# Patient Record
Sex: Male | Born: 2000 | Race: White | Hispanic: Yes | Marital: Single | Smoking: Light tobacco smoker
Health system: Southern US, Community
[De-identification: ages and names within clinical notes are randomized; demographics above are authoritative.]

---

## 2019-11-15 ENCOUNTER — Inpatient Hospital Stay (HOSPITAL_COMMUNITY): Payer: No Typology Code available for payment source | Admitting: Certified Registered Nurse Anesthetist

## 2019-11-15 ENCOUNTER — Encounter (HOSPITAL_COMMUNITY): Payer: Self-pay

## 2019-11-15 ENCOUNTER — Observation Stay (HOSPITAL_COMMUNITY)
Admission: EM | Admit: 2019-11-15 | Discharge: 2019-11-17 | Disposition: A | Discharge status: Home / Self Care | Payer: No Typology Code available for payment source | Attending: Orthopedic Surgery | Admitting: Orthopedic Surgery

## 2019-11-15 ENCOUNTER — Inpatient Hospital Stay (HOSPITAL_COMMUNITY): Payer: No Typology Code available for payment source

## 2019-11-15 ENCOUNTER — Encounter (HOSPITAL_COMMUNITY)
Admission: EM | Disposition: A | Discharge status: Home / Self Care | Payer: Self-pay | Source: Home / Self Care | Attending: Orthopedic Surgery

## 2019-11-15 ENCOUNTER — Emergency Department (HOSPITAL_COMMUNITY): Payer: No Typology Code available for payment source

## 2019-11-15 DIAGNOSIS — Z419 Encounter for procedure for purposes other than remedying health state, unspecified: Secondary | ICD-10-CM

## 2019-11-15 DIAGNOSIS — Y929 Unspecified place or not applicable: Secondary | ICD-10-CM | POA: Insufficient documentation

## 2019-11-15 DIAGNOSIS — Y939 Activity, unspecified: Secondary | ICD-10-CM | POA: Insufficient documentation

## 2019-11-15 DIAGNOSIS — S7291XA Unspecified fracture of right femur, initial encounter for closed fracture: Principal | ICD-10-CM | POA: Insufficient documentation

## 2019-11-15 DIAGNOSIS — Z23 Encounter for immunization: Secondary | ICD-10-CM | POA: Insufficient documentation

## 2019-11-15 DIAGNOSIS — S79821A Other specified injuries of right thigh, initial encounter: Secondary | ICD-10-CM | POA: Diagnosis present

## 2019-11-15 DIAGNOSIS — S7290XA Unspecified fracture of unspecified femur, initial encounter for closed fracture: Secondary | ICD-10-CM | POA: Diagnosis present

## 2019-11-15 DIAGNOSIS — S7291XD Unspecified fracture of right femur, subsequent encounter for closed fracture with routine healing: Secondary | ICD-10-CM

## 2019-11-15 DIAGNOSIS — Z20822 Contact with and (suspected) exposure to covid-19: Secondary | ICD-10-CM | POA: Insufficient documentation

## 2019-11-15 DIAGNOSIS — S72331D Displaced oblique fracture of shaft of right femur, subsequent encounter for closed fracture with routine healing: Secondary | ICD-10-CM

## 2019-11-15 DIAGNOSIS — Y999 Unspecified external cause status: Secondary | ICD-10-CM | POA: Insufficient documentation

## 2019-11-15 DIAGNOSIS — Q899 Congenital malformation, unspecified: Secondary | ICD-10-CM

## 2019-11-15 HISTORY — PX: FEMUR IM NAIL: SHX1597

## 2019-11-15 LAB — CBC
HCT: 44.8 % (ref 39.0–52.0)
Hemoglobin: 15.1 g/dL (ref 13.0–17.0)
MCH: 30.4 pg (ref 26.0–34.0)
MCHC: 33.7 g/dL (ref 30.0–36.0)
MCV: 90.3 fL (ref 80.0–100.0)
Platelets: 259 10*3/uL (ref 150–400)
RBC: 4.96 MIL/uL (ref 4.22–5.81)
RDW: 12.1 % (ref 11.5–15.5)
WBC: 15.6 10*3/uL — ABNORMAL HIGH (ref 4.0–10.5)
nRBC: 0 % (ref 0.0–0.2)

## 2019-11-15 LAB — COMPREHENSIVE METABOLIC PANEL
ALT: 47 U/L — ABNORMAL HIGH (ref 0–44)
AST: 49 U/L — ABNORMAL HIGH (ref 15–41)
Albumin: 4 g/dL (ref 3.5–5.0)
Alkaline Phosphatase: 45 U/L (ref 38–126)
Anion gap: 12 (ref 5–15)
BUN: 10 mg/dL (ref 6–20)
CO2: 22 mmol/L (ref 22–32)
Calcium: 8.7 mg/dL — ABNORMAL LOW (ref 8.9–10.3)
Chloride: 104 mmol/L (ref 98–111)
Creatinine, Ser: 0.99 mg/dL (ref 0.61–1.24)
GFR calc Af Amer: >60 mL/min (ref >60)
GFR calc non Af Amer: >60 mL/min (ref >60)
Glucose, Bld: 139 mg/dL — ABNORMAL HIGH (ref 70–99)
Potassium: 3.7 mmol/L (ref 3.5–5.1)
Sodium: 138 mmol/L (ref 135–145)
Total Bilirubin: 1 mg/dL (ref 0.3–1.2)
Total Protein: 6.2 g/dL — ABNORMAL LOW (ref 6.5–8.1)

## 2019-11-15 LAB — I-STAT CHEM 8, ED
BUN: 10 mg/dL (ref 6–20)
Calcium, Ion: 1.09 mmol/L — ABNORMAL LOW (ref 1.15–1.40)
Chloride: 105 mmol/L (ref 98–111)
Creatinine, Ser: 1.3 mg/dL — ABNORMAL HIGH (ref 0.61–1.24)
Glucose, Bld: 137 mg/dL — ABNORMAL HIGH (ref 70–99)
HCT: 43 % (ref 39.0–52.0)
Hemoglobin: 14.6 g/dL (ref 13.0–17.0)
Potassium: 3.5 mmol/L (ref 3.5–5.1)
Sodium: 142 mmol/L (ref 135–145)
TCO2: 23 mmol/L (ref 22–32)

## 2019-11-15 LAB — SAMPLE TO BLOOD BANK

## 2019-11-15 LAB — SARS CORONAVIRUS 2 BY RT PCR (HOSPITAL ORDER, PERFORMED IN ~~LOC~~ HOSPITAL LAB): SARS Coronavirus 2: NEGATIVE

## 2019-11-15 LAB — MRSA PCR SCREENING: MRSA by PCR: NEGATIVE

## 2019-11-15 LAB — PROTIME-INR
INR: 1 (ref 0.8–1.2)
Prothrombin Time: 12.8 seconds (ref 11.4–15.2)

## 2019-11-15 LAB — LACTIC ACID, PLASMA: Lactic Acid, Venous: 3.4 mmol/L (ref 0.5–1.9)

## 2019-11-15 LAB — ETHANOL: Alcohol, Ethyl (B): 119 mg/dL — ABNORMAL HIGH (ref <10)

## 2019-11-15 SURGERY — INSERTION, INTRAMEDULLARY ROD, FEMUR
Anesthesia: General | Site: Leg Upper | Laterality: Right

## 2019-11-15 MED ORDER — MIDAZOLAM HCL 2 MG/2ML IJ SOLN
INTRAMUSCULAR | Status: AC
  Filled 2019-11-15: qty 2

## 2019-11-15 MED ORDER — CEFAZOLIN SODIUM-DEXTROSE 2-4 GM/100ML-% IV SOLN
2.0000 g | INTRAVENOUS | Status: DC
  Administered 2019-11-15: 2 g via INTRAVENOUS
  Filled 2019-11-15: qty 100

## 2019-11-15 MED ORDER — PROPOFOL 10 MG/ML IV BOLUS
INTRAVENOUS | Status: AC
  Filled 2019-11-15: qty 20

## 2019-11-15 MED ORDER — FENTANYL CITRATE (PF) 100 MCG/2ML IJ SOLN
INTRAMUSCULAR | Status: AC
  Filled 2019-11-15: qty 2

## 2019-11-15 MED ORDER — HYDROMORPHONE HCL 1 MG/ML IJ SOLN
1.0000 mg | Freq: Once | INTRAMUSCULAR | Status: AC
  Administered 2019-11-15: 1 mg via INTRAVENOUS
  Filled 2019-11-15: qty 1

## 2019-11-15 MED ORDER — SUCCINYLCHOLINE CHLORIDE 200 MG/10ML IV SOSY
PREFILLED_SYRINGE | INTRAVENOUS | Status: AC
  Filled 2019-11-15: qty 10

## 2019-11-15 MED ORDER — ACETAMINOPHEN 500 MG PO TABS: 1000.0000 mg | ORAL_TABLET | Freq: Once | ORAL | Status: DC | PRN

## 2019-11-15 MED ORDER — FENTANYL CITRATE (PF) 250 MCG/5ML IJ SOLN
INTRAMUSCULAR | Status: AC
  Filled 2019-11-15: qty 5

## 2019-11-15 MED ORDER — LACTATED RINGERS IV BOLUS
1000.0000 mL | Freq: Once | INTRAVENOUS | Status: AC
  Administered 2019-11-15: 1000 mL via INTRAVENOUS

## 2019-11-15 MED ORDER — CEFAZOLIN SODIUM-DEXTROSE 2-4 GM/100ML-% IV SOLN: 2.0000 g | INTRAVENOUS | Status: DC

## 2019-11-15 MED ORDER — FENTANYL CITRATE (PF) 100 MCG/2ML IJ SOLN
100.0000 ug | Freq: Once | INTRAMUSCULAR | Status: AC
  Administered 2019-11-15: 100 ug via INTRAVENOUS

## 2019-11-15 MED ORDER — ACETAMINOPHEN 10 MG/ML IV SOLN
INTRAVENOUS | Status: AC
  Administered 2019-11-15: 1000 mg via INTRAVENOUS
  Filled 2019-11-15: qty 100

## 2019-11-15 MED ORDER — DEXAMETHASONE SODIUM PHOSPHATE 10 MG/ML IJ SOLN
INTRAMUSCULAR | Status: DC | PRN
  Administered 2019-11-15: 10 mg via INTRAVENOUS

## 2019-11-15 MED ORDER — ASPIRIN EC 81 MG PO TBEC
81.0000 mg | DELAYED_RELEASE_TABLET | Freq: Every day | ORAL | Status: DC
  Administered 2019-11-16: 81 mg via ORAL
  Filled 2019-11-15: qty 1

## 2019-11-15 MED ORDER — CHLORHEXIDINE GLUCONATE 0.12 % MT SOLN
15.0000 mL | Freq: Once | OROMUCOSAL | Status: AC
  Filled 2019-11-15: qty 15

## 2019-11-15 MED ORDER — ONDANSETRON HCL 4 MG/2ML IJ SOLN
INTRAMUSCULAR | Status: DC | PRN
  Administered 2019-11-15: 4 mg via INTRAVENOUS

## 2019-11-15 MED ORDER — ONDANSETRON HCL 4 MG/2ML IJ SOLN
4.0000 mg | Freq: Four times a day (QID) | INTRAMUSCULAR | Status: DC | PRN
  Administered 2019-11-15: 4 mg via INTRAVENOUS
  Filled 2019-11-15 (×2): qty 2

## 2019-11-15 MED ORDER — ONDANSETRON HCL 4 MG PO TABS
4.0000 mg | ORAL_TABLET | Freq: Four times a day (QID) | ORAL | Status: DC | PRN
  Administered 2019-11-17: 4 mg via ORAL
  Filled 2019-11-15: qty 1

## 2019-11-15 MED ORDER — DEXAMETHASONE SODIUM PHOSPHATE 10 MG/ML IJ SOLN
INTRAMUSCULAR | Status: AC
  Filled 2019-11-15: qty 1

## 2019-11-15 MED ORDER — PHENYLEPHRINE 40 MCG/ML (10ML) SYRINGE FOR IV PUSH (FOR BLOOD PRESSURE SUPPORT)
PREFILLED_SYRINGE | INTRAVENOUS | Status: AC
  Filled 2019-11-15: qty 10

## 2019-11-15 MED ORDER — TETANUS-DIPHTH-ACELL PERTUSSIS 5-2.5-18.5 LF-MCG/0.5 IM SUSP
0.5000 mL | Freq: Once | INTRAMUSCULAR | Status: AC
  Administered 2019-11-15: 0.5 mL via INTRAMUSCULAR
  Filled 2019-11-15: qty 0.5

## 2019-11-15 MED ORDER — CELECOXIB 200 MG PO CAPS
200.0000 mg | ORAL_CAPSULE | Freq: Two times a day (BID) | ORAL | Status: DC
  Administered 2019-11-15 – 2019-11-17 (×4): 200 mg via ORAL
  Filled 2019-11-15 (×5): qty 1

## 2019-11-15 MED ORDER — ROCURONIUM BROMIDE 10 MG/ML (PF) SYRINGE
PREFILLED_SYRINGE | INTRAVENOUS | Status: DC | PRN
  Administered 2019-11-15 (×2): 30 mg via INTRAVENOUS
  Administered 2019-11-15: 100 mg via INTRAVENOUS

## 2019-11-15 MED ORDER — OXYCODONE HCL 5 MG PO TABS
ORAL_TABLET | ORAL | Status: AC
  Filled 2019-11-15: qty 1

## 2019-11-15 MED ORDER — HYDROCODONE-ACETAMINOPHEN 5-325 MG PO TABS
1.0000 | ORAL_TABLET | ORAL | Status: DC | PRN
  Administered 2019-11-15 – 2019-11-17 (×4): 2 via ORAL
  Filled 2019-11-15 (×4): qty 2

## 2019-11-15 MED ORDER — HYDROMORPHONE HCL 1 MG/ML IJ SOLN
1.0000 mg | Freq: Once | INTRAMUSCULAR | Status: AC
  Administered 2019-11-15: 1 mg via INTRAVENOUS

## 2019-11-15 MED ORDER — OXYCODONE HCL 5 MG PO TABS
5.0000 mg | ORAL_TABLET | Freq: Once | ORAL | Status: AC | PRN
  Administered 2019-11-15: 5 mg via ORAL

## 2019-11-15 MED ORDER — ACETAMINOPHEN 160 MG/5ML PO SOLN: 1000.0000 mg | Freq: Once | ORAL | Status: DC | PRN

## 2019-11-15 MED ORDER — POVIDONE-IODINE 10 % EX SWAB
2.0000 "application " | Freq: Once | CUTANEOUS | Status: AC
  Administered 2019-11-15: 2 via TOPICAL

## 2019-11-15 MED ORDER — ALBUMIN HUMAN 5 % IV SOLN: INTRAVENOUS | Status: DC | PRN

## 2019-11-15 MED ORDER — PHENYLEPHRINE 40 MCG/ML (10ML) SYRINGE FOR IV PUSH (FOR BLOOD PRESSURE SUPPORT)
PREFILLED_SYRINGE | INTRAVENOUS | Status: DC | PRN
  Administered 2019-11-15: 80 ug via INTRAVENOUS

## 2019-11-15 MED ORDER — METOCLOPRAMIDE HCL 5 MG PO TABS: 5.0000 mg | ORAL_TABLET | Freq: Three times a day (TID) | ORAL | Status: DC | PRN

## 2019-11-15 MED ORDER — MORPHINE SULFATE (PF) 2 MG/ML IV SOLN: 0.5000 mg | INTRAVENOUS | Status: DC | PRN

## 2019-11-15 MED ORDER — SUGAMMADEX SODIUM 200 MG/2ML IV SOLN
INTRAVENOUS | Status: DC | PRN
  Administered 2019-11-15: 200 mg via INTRAVENOUS

## 2019-11-15 MED ORDER — ACETAMINOPHEN 325 MG PO TABS: 325.0000 mg | ORAL_TABLET | Freq: Four times a day (QID) | ORAL | Status: DC | PRN

## 2019-11-15 MED ORDER — DEXMEDETOMIDINE HCL 200 MCG/2ML IV SOLN
INTRAVENOUS | Status: DC | PRN
  Administered 2019-11-15: 12 ug via INTRAVENOUS
  Administered 2019-11-15: 8 ug via INTRAVENOUS

## 2019-11-15 MED ORDER — OXYCODONE HCL 5 MG/5ML PO SOLN: 5.0000 mg | Freq: Once | ORAL | Status: AC | PRN

## 2019-11-15 MED ORDER — MIDAZOLAM HCL 2 MG/2ML IJ SOLN
INTRAMUSCULAR | Status: DC | PRN
  Administered 2019-11-15: 2 mg via INTRAVENOUS

## 2019-11-15 MED ORDER — POVIDONE-IODINE 10 % EX SWAB: 2.0000 "application " | Freq: Once | CUTANEOUS | Status: DC

## 2019-11-15 MED ORDER — ENSURE PRE-SURGERY PO LIQD
296.0000 mL | Freq: Once | ORAL | Status: AC
  Administered 2019-11-15: 296 mL via ORAL
  Filled 2019-11-15: qty 296

## 2019-11-15 MED ORDER — LACTATED RINGERS IV SOLN: INTRAVENOUS | Status: DC | PRN

## 2019-11-15 MED ORDER — HYDROMORPHONE HCL 1 MG/ML IJ SOLN
INTRAMUSCULAR | Status: AC
  Filled 2019-11-15: qty 1

## 2019-11-15 MED ORDER — SODIUM CHLORIDE 0.9 % IV SOLN: INTRAVENOUS | Status: DC

## 2019-11-15 MED ORDER — METOCLOPRAMIDE HCL 5 MG/ML IJ SOLN: 5.0000 mg | Freq: Three times a day (TID) | INTRAMUSCULAR | Status: DC | PRN

## 2019-11-15 MED ORDER — ROCURONIUM BROMIDE 10 MG/ML (PF) SYRINGE
PREFILLED_SYRINGE | INTRAVENOUS | Status: AC
  Filled 2019-11-15: qty 10

## 2019-11-15 MED ORDER — LIDOCAINE 2% (20 MG/ML) 5 ML SYRINGE
INTRAMUSCULAR | Status: AC
  Filled 2019-11-15: qty 5

## 2019-11-15 MED ORDER — PROPOFOL 10 MG/ML IV BOLUS
INTRAVENOUS | Status: DC | PRN
  Administered 2019-11-15 (×2): 30 mg via INTRAVENOUS
  Administered 2019-11-15 (×2): 20 mg via INTRAVENOUS
  Administered 2019-11-15: 150 mg via INTRAVENOUS

## 2019-11-15 MED ORDER — 0.9 % SODIUM CHLORIDE (POUR BTL) OPTIME
TOPICAL | Status: DC | PRN
  Administered 2019-11-15: 2000 mL

## 2019-11-15 MED ORDER — HYDROMORPHONE HCL 1 MG/ML IJ SOLN
INTRAMUSCULAR | Status: AC
  Administered 2019-11-15: 0.5 mg via INTRAVENOUS
  Filled 2019-11-15: qty 1

## 2019-11-15 MED ORDER — EPHEDRINE 5 MG/ML INJ
INTRAVENOUS | Status: AC
  Filled 2019-11-15: qty 10

## 2019-11-15 MED ORDER — ONDANSETRON HCL 4 MG/2ML IJ SOLN
INTRAMUSCULAR | Status: AC
  Filled 2019-11-15: qty 2

## 2019-11-15 MED ORDER — DEXMEDETOMIDINE (PRECEDEX) IN NS 20 MCG/5ML (4 MCG/ML) IV SYRINGE
PREFILLED_SYRINGE | INTRAVENOUS | Status: AC
  Filled 2019-11-15: qty 5

## 2019-11-15 MED ORDER — IOHEXOL 300 MG/ML  SOLN
100.0000 mL | Freq: Once | INTRAMUSCULAR | Status: AC | PRN
  Administered 2019-11-15: 100 mL via INTRAVENOUS

## 2019-11-15 MED ORDER — HYDROMORPHONE HCL 1 MG/ML IJ SOLN
0.2500 mg | INTRAMUSCULAR | Status: DC | PRN
  Administered 2019-11-15: 0.5 mg via INTRAVENOUS

## 2019-11-15 MED ORDER — LIDOCAINE 2% (20 MG/ML) 5 ML SYRINGE
INTRAMUSCULAR | Status: DC | PRN
  Administered 2019-11-15: 60 mg via INTRAVENOUS

## 2019-11-15 MED ORDER — ACETAMINOPHEN 10 MG/ML IV SOLN: 1000.0000 mg | Freq: Once | INTRAVENOUS | Status: DC | PRN

## 2019-11-15 MED ORDER — DOCUSATE SODIUM 100 MG PO CAPS
100.0000 mg | ORAL_CAPSULE | Freq: Two times a day (BID) | ORAL | Status: DC
  Administered 2019-11-15 – 2019-11-17 (×4): 100 mg via ORAL
  Filled 2019-11-15 (×4): qty 1

## 2019-11-15 MED ORDER — CEFAZOLIN SODIUM-DEXTROSE 2-4 GM/100ML-% IV SOLN
2.0000 g | Freq: Four times a day (QID) | INTRAVENOUS | Status: AC
  Administered 2019-11-15 – 2019-11-16 (×3): 2 g via INTRAVENOUS
  Filled 2019-11-15 (×3): qty 100

## 2019-11-15 MED ORDER — SODIUM CHLORIDE 0.9 % IV SOLN: INTRAVENOUS | Status: AC

## 2019-11-15 MED ORDER — FENTANYL CITRATE (PF) 100 MCG/2ML IJ SOLN
50.0000 ug | INTRAMUSCULAR | Status: DC | PRN
  Administered 2019-11-15: 50 ug via INTRAVENOUS
  Filled 2019-11-15: qty 2

## 2019-11-15 MED ORDER — CHLORHEXIDINE GLUCONATE 4 % EX LIQD: 60.0000 mL | Freq: Once | CUTANEOUS | Status: DC

## 2019-11-15 MED ORDER — CHLORHEXIDINE GLUCONATE 0.12 % MT SOLN
OROMUCOSAL | Status: AC
  Administered 2019-11-15: 15 mL via OROMUCOSAL
  Filled 2019-11-15: qty 15

## 2019-11-15 MED ORDER — FENTANYL CITRATE (PF) 250 MCG/5ML IJ SOLN
INTRAMUSCULAR | Status: DC | PRN
  Administered 2019-11-15: 100 ug via INTRAVENOUS
  Administered 2019-11-15: 50 ug via INTRAVENOUS
  Administered 2019-11-15 (×3): 100 ug via INTRAVENOUS
  Administered 2019-11-15: 50 ug via INTRAVENOUS

## 2019-11-15 SURGICAL SUPPLY — 51 items
ALCOHOL 70% 16 OZ (MISCELLANEOUS) ×3 IMPLANT
BIT DRILL LONG 4.0 (BIT) ×1 IMPLANT
BIT DRILL SHORT 4.0 (BIT) ×1 IMPLANT
BLADE CLIPPER SURG (BLADE) ×3 IMPLANT
BLADE SURG 15 STRL LF DISP TIS (BLADE) ×1 IMPLANT
BLADE SURG 15 STRL SS (BLADE) ×2
BNDG ELASTIC 6X15 VLCR STRL LF (GAUZE/BANDAGES/DRESSINGS) ×3 IMPLANT
COVER PERINEAL POST (MISCELLANEOUS) ×3 IMPLANT
COVER SURGICAL LIGHT HANDLE (MISCELLANEOUS) ×3 IMPLANT
DRAPE C-ARM 42X120 X-RAY (DRAPES) ×3 IMPLANT
DRAPE HALF SHEET 40X57 (DRAPES) ×3 IMPLANT
DRAPE ORTHO SPLIT 77X108 STRL (DRAPES)
DRAPE STERI IOBAN 125X83 (DRAPES) ×3 IMPLANT
DRAPE SURG ORHT 6 SPLT 77X108 (DRAPES) IMPLANT
DRILL BIT LONG 4.0 (BIT) ×3
DRILL BIT SHORT 4.0 (BIT) ×2
DRSG AQUACEL AG ADV 3.5X 4 (GAUZE/BANDAGES/DRESSINGS) ×3 IMPLANT
DRSG AQUACEL AG ADV 3.5X 6 (GAUZE/BANDAGES/DRESSINGS) ×3 IMPLANT
DURAPREP 26ML APPLICATOR (WOUND CARE) ×3 IMPLANT
ELECT REM PT RETURN 9FT ADLT (ELECTROSURGICAL) ×3
ELECTRODE REM PT RTRN 9FT ADLT (ELECTROSURGICAL) ×1 IMPLANT
GLOVE BIOGEL PI IND STRL 8 (GLOVE) ×3 IMPLANT
GLOVE BIOGEL PI INDICATOR 8 (GLOVE) ×6
GLOVE ECLIPSE 8.0 STRL XLNG CF (GLOVE) ×6 IMPLANT
GOWN STRL REUS W/ TWL LRG LVL3 (GOWN DISPOSABLE) ×1 IMPLANT
GOWN STRL REUS W/ TWL XL LVL3 (GOWN DISPOSABLE) ×1 IMPLANT
GOWN STRL REUS W/TWL LRG LVL3 (GOWN DISPOSABLE) ×2
GOWN STRL REUS W/TWL XL LVL3 (GOWN DISPOSABLE) ×2
GUIDE PIN 3.2X343 (PIN) ×1
GUIDE PIN 3.2X343MM (PIN) ×2
GUIDE ROD 3.0 (MISCELLANEOUS) ×3
KIT BASIN OR (CUSTOM PROCEDURE TRAY) ×3 IMPLANT
KIT TURNOVER KIT B (KITS) ×3 IMPLANT
NAIL CAP 15MM (Nail) ×3 IMPLANT
NAIL LOCK COMPR 11.5X42 RT (Nail) ×3 IMPLANT
NS IRRIG 1000ML POUR BTL (IV SOLUTION) ×6 IMPLANT
PACK GENERAL/GYN (CUSTOM PROCEDURE TRAY) ×3 IMPLANT
PAD ARMBOARD 7.5X6 YLW CONV (MISCELLANEOUS) ×6 IMPLANT
PIN GUIDE 3.2X343MM (PIN) ×1 IMPLANT
ROD GUIDE 3.0 (MISCELLANEOUS) ×1 IMPLANT
SCREW TRIGEN LOW PROF 5.0X47.5 (Screw) ×3 IMPLANT
SCREW TRIGEN LOW PROF 5.0X90 (Screw) ×3 IMPLANT
SOL PREP POV-IOD 4OZ 10% (MISCELLANEOUS) ×3 IMPLANT
SPONGE LAP 4X18 RFD (DISPOSABLE) ×3 IMPLANT
STAPLER VISISTAT 35W (STAPLE) ×3 IMPLANT
SUT ETHILON 3 0 PS 1 (SUTURE) ×12 IMPLANT
SUT VIC AB 0 CT1 36 (SUTURE) ×3 IMPLANT
SUT VIC AB 2-0 CTB1 (SUTURE) ×9 IMPLANT
TAPE STRIPS DRAPE STRL (GAUZE/BANDAGES/DRESSINGS) IMPLANT
TOWEL GREEN STERILE (TOWEL DISPOSABLE) ×3 IMPLANT
TOWEL GREEN STERILE FF (TOWEL DISPOSABLE) ×3 IMPLANT
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
## 2019-11-15 NOTE — ED Notes (Signed)
All lab worked collected and sent down to lab by Designer, industrial/product. Pt medicated per MAR.

## 2019-11-15 NOTE — Progress Notes (Signed)
Orthopedic Tech Progress Note Patient Details:  Cameron Ross Jul 22, 2000 [id number]  Ortho Devices Type of Ortho Device: Post (long leg) splint, Stirrup splint Ortho Device/Splint Location: rle. posterior long leg and long stirrups(from internal groin around foot back to illiac crest). as requested by dr dean. Ortho Device/Splint Interventions: Ordered, Application, Adjustment   Post Interventions Patient Tolerated: Well Instructions Provided: Care of device, Adjustment of device   [name] 11/15/2019, 3:18 AM

## 2019-11-15 NOTE — Anesthesia Procedure Notes (Signed)
Procedure Name: Intubation Date/Time: 11/15/2019 10:30 AM Performed by: Darletta Moll, CRNA Pre-anesthesia Checklist: Patient identified, Emergency Drugs available, Suction available and Patient being monitored Patient Re-evaluated:Patient Re-evaluated prior to induction Oxygen Delivery Method: Circle system utilized Preoxygenation: Pre-oxygenation with 100% oxygen Induction Type: IV induction Ventilation: Mask ventilation without difficulty Laryngoscope Size: Mac and 4 Grade View: Grade I Tube type: Oral Tube size: 7.5 mm Number of attempts: 1 Airway Equipment and Method: Stylet Placement Confirmation: ETT inserted through vocal cords under direct vision,  positive ETCO2 and breath sounds checked- equal and bilateral Secured at: 22 cm Tube secured with: Tape Dental Injury: Teeth and Oropharynx as per pre-operative assessment

## 2019-11-15 NOTE — Brief Op Note (Signed)
   11/15/2019  12:58 PM  PATIENT:  Joelyn Oms  19 y.o. male  PRE-OPERATIVE DIAGNOSIS:  Fractured right femur  POST-OPERATIVE DIAGNOSIS:  Fractured right femur  PROCEDURE:  Procedure(s): INTRAMEDULLARY (IM) NAIL FEMORAL  SURGEON:  Surgeon(s): Cammy Copa, MD  ASSISTANT: none  ANESTHESIA:   general  EBL: 200 ml    Total I/O In: 1350 [I.V.:1000; IV Piggyback:350] Out: 300 [Blood:300]  BLOOD ADMINISTERED: none  DRAINS: none   LOCAL MEDICATIONS USED:  none  SPECIMEN:  No Specimen  COUNTS:  YES  TOURNIQUET:  * No tourniquets in log *  DICTATION: .Other Dictation: Dictation Number [id number]  PLAN OF CARE: Admit for overnight observation  PATIENT DISPOSITION:  PACU - hemodynamically stable

## 2019-11-15 NOTE — Transfer of Care (Signed)
Immediate Anesthesia Transfer of Care Note  Patient: Cameron Ross  Procedure(s) Performed: INTRAMEDULLARY (IM) NAIL FEMORAL (Right Leg Upper)  Patient Location: PACU  Anesthesia Type:General  Level of Consciousness: drowsy and patient cooperative  Airway & Oxygen Therapy: Patient Spontanous Breathing and Patient connected to face mask oxygen  Post-op Assessment: Report given to RN and Post -op Vital signs reviewed and stable  Post vital signs: Reviewed and stable  Last Vitals:  Vitals Value Taken Time  BP 168/90 11/15/19 1303  Temp 37.2 C 11/15/19 1303  Pulse 91 11/15/19 1308  Resp 14 11/15/19 1308  SpO2 100 % 11/15/19 1308  Vitals shown include unvalidated device data.  Last Pain:  Vitals:   11/15/19 1303  TempSrc:   PainSc: (P) Asleep         Complications: No complications documented.

## 2019-11-15 NOTE — ED Notes (Signed)
Pt resting , pt appears to be in NAD at this time , pt attached to cardiac monitor and pulse ox, pt denies any needs

## 2019-11-15 NOTE — Anesthesia Preprocedure Evaluation (Signed)
Anesthesia Evaluation  Patient identified by MRN, date of birth, ID band Patient awake    Reviewed: Allergy & Precautions, NPO status , Patient's Chart, lab work & pertinent test results  History of Anesthesia Complications Negative for: history of anesthetic complications  Airway Mallampati: I  TM Distance: >3 FB Neck ROM: Full    Dental  (+) Dental Advisory Given, Teeth Intact   Pulmonary neg recent URI, Current Smoker and Patient abstained from smoking.,    breath sounds clear to auscultation       Cardiovascular negative cardio ROS   Rhythm:Regular     Neuro/Psych negative neurological ROS  negative psych ROS   GI/Hepatic negative GI ROS, Neg liver ROS,   Endo/Other  negative endocrine ROS  Renal/GU negative Renal ROS  negative genitourinary   Musculoskeletal Fractured right femur   Abdominal   Peds  Hematology negative hematology ROS (+)   Anesthesia Other Findings   Reproductive/Obstetrics                             Anesthesia Physical Anesthesia Plan  ASA: II  Anesthesia Plan: General   Post-op Pain Management:    Induction: Intravenous, Rapid sequence and Cricoid pressure planned  PONV Risk Score and Plan: 1 and Ondansetron and Dexamethasone  Airway Management Planned: Oral ETT  Additional Equipment: None  Intra-op Plan:   Post-operative Plan: Extubation in OR  Informed Consent: I have reviewed the patients History and Physical, chart, labs and discussed the procedure including the risks, benefits and alternatives for the proposed anesthesia with the patient or authorized representative who has indicated his/her understanding and acceptance.     Dental advisory given  Plan Discussed with: CRNA and Surgeon  Anesthesia Plan Comments:         Anesthesia Quick Evaluation
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
## 2019-11-15 NOTE — Plan of Care (Signed)
  Problem: Health Behavior/Discharge Planning: Goal: Ability to manage health-related needs will improve Outcome: Progressing   Problem: Clinical Measurements: Goal: Ability to maintain clinical measurements within normal limits will improve Outcome: Progressing   Problem: Nutrition: Goal: Adequate nutrition will be maintained Outcome: Progressing   Problem: Coping: Goal: Level of anxiety will decrease Outcome: Progressing   Problem: Elimination: Goal: Will not experience complications related to bowel motility Outcome: Progressing   Problem: Safety: Goal: Ability to remain free from injury will improve Outcome: Progressing   Problem: Skin Integrity: Goal: Risk for impaired skin integrity will decrease Outcome: Progressing   

## 2019-11-15 NOTE — Plan of Care (Signed)
  Problem: Education: Goal: Knowledge of General Education information will improve Description: Including pain rating scale, medication(s)/side effects and non-pharmacologic comfort measures Outcome: Progressing   Problem: Coping: Goal: Level of anxiety will decrease Outcome: Progressing   Problem: Safety: Goal: Ability to remain free from injury will improve Outcome: Progressing   Problem: Self-Concept: Goal: Ability to maintain and perform role responsibilities to the fullest extent possible will improve Outcome: Progressing   Problem: Pain Management: Goal: Pain level will decrease Outcome: Progressing
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
## 2019-11-16 ENCOUNTER — Encounter (HOSPITAL_COMMUNITY): Payer: Self-pay | Admitting: Orthopedic Surgery

## 2019-11-16 MED ORDER — ASPIRIN 81 MG PO TBEC: 81.0000 mg | DELAYED_RELEASE_TABLET | Freq: Every day | ORAL | 11 refills | Status: AC

## 2019-11-16 MED ORDER — HYDROCODONE-ACETAMINOPHEN 5-325 MG PO TABS: 1.0000 | ORAL_TABLET | ORAL | 0 refills | Status: DC | PRN

## 2019-11-16 MED ORDER — METHOCARBAMOL 500 MG PO TABS
500.0000 mg | ORAL_TABLET | Freq: Three times a day (TID) | ORAL | 0 refills | Status: DC | PRN
Start: 2019-11-16 — End: 2019-11-25

## 2019-11-16 MED ORDER — DOCUSATE SODIUM 100 MG PO CAPS: 100.0000 mg | ORAL_CAPSULE | Freq: Two times a day (BID) | ORAL | 0 refills | Status: AC

## 2019-11-16 MED ORDER — CELECOXIB 200 MG PO CAPS: 200.0000 mg | ORAL_CAPSULE | Freq: Two times a day (BID) | ORAL | 0 refills | Status: DC
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
## 2019-11-16 NOTE — Progress Notes (Signed)
Dr. August Saucer contacted by phone and notified of loss of IV access and that PT feels patient needs to stay another night.  MD states patient does not need IV access and patient may stay overnight.
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
## 2019-11-16 NOTE — Plan of Care (Signed)
  Problem: Education: Goal: Knowledge of General Education information will improve Description Including pain rating scale, medication(s)/side effects and non-pharmacologic comfort measures Outcome: Progressing   Problem: Health Behavior/Discharge Planning: Goal: Ability to manage health-related needs will improve Outcome: Progressing   

## 2019-11-16 NOTE — Progress Notes (Signed)
  Subjective: Patient stable.  Pain controlled   Objective: Vital signs in last 24 hours: Temp:  [98.2 F (36.8 C)-99.3 F (37.4 C)] 98.7 F (37.1 C) (08/09 0421) Pulse Rate:  [71-106] 88 (08/09 0421) Resp:  [14-22] 14 (08/09 0421) BP: (137-174)/(67-92) 139/67 (08/09 0421) SpO2:  [93 %-100 %] 100 % (08/09 0421)  Intake/Output from previous day: 08/08 0701 - 08/09 0700 In: 3130.6 [P.O.:120; I.V.:2460.6; IV Piggyback:550] Out: 3330 [Urine:3030; Blood:300] Intake/Output this shift: Total I/O In: -  Out: 1080 [Urine:1080]  Exam:  Neurologically intact Sensation intact distally Intact pulses distally Dorsiflexion/Plantar flexion intact  Labs: Recent Labs    11/15/19 0158 11/15/19 0159  HGB 14.6 15.1   Recent Labs    11/15/19 0158 11/15/19 0159  WBC  --  15.6*  RBC  --  4.96  HCT 43.0 44.8  PLT  --  259   Recent Labs    11/15/19 0158 11/15/19 0159  NA 142 138  K 3.5 3.7  CL 105 104  CO2  --  22  BUN 10 10  CREATININE 1.30* 0.99  GLUCOSE 137* 139*  CALCIUM  --  8.7*   Recent Labs    11/15/19 0159  INR 1.0    Assessment/Plan: Plan at this time is for him to be seen by therapy this morning.  50% partial weightbearing right lower extremity.  Should be able to discharge home this afternoon.  Follow-up in 10 days for suture removal.   Burnard Bunting 11/16/2019, 6:55 AM

## 2019-11-16 NOTE — Plan of Care (Signed)

## 2019-11-17 NOTE — Progress Notes (Signed)
Provided discharge education/instructions, all questions and concerns addressed, Pt not in distress, RW and BSC delivered to room, discharged home with belongings accompanied by friend.

## 2019-11-17 NOTE — Plan of Care (Signed)
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
## 2019-11-17 NOTE — Progress Notes (Signed)
Patient stable.  More mobile today than yesterday Plan discharge today with walker and home health PT Follow-up in 10 days for suture removal

## 2019-11-21 NOTE — Anesthesia Postprocedure Evaluation (Signed)
Anesthesia Post Note  Patient: Cameron Ross  Procedure(s) Performed: INTRAMEDULLARY (IM) NAIL FEMORAL (Right Leg Upper)     Patient location during evaluation: PACU Anesthesia Type: General Level of consciousness: awake and alert Pain management: pain level controlled Vital Signs Assessment: post-procedure vital signs reviewed and stable Respiratory status: spontaneous breathing, nonlabored ventilation, respiratory function stable and patient connected to nasal cannula oxygen Cardiovascular status: blood pressure returned to baseline and stable Postop Assessment: no apparent nausea or vomiting Anesthetic complications: no   No complications documented.  Last Vitals:  Vitals:   11/17/19 0513 11/17/19 0828  BP: 128/74 113/66  Pulse: 73 (!) 102  Resp: 19 17  Temp: 36.9 C 36.9 C  SpO2: 100% 100%    Last Pain:  Vitals:   11/17/19 1603  TempSrc:   PainSc: 6                  Dabney Schanz

## 2019-11-25 ENCOUNTER — Telehealth: Payer: Self-pay

## 2019-11-25 ENCOUNTER — Other Ambulatory Visit: Payer: Self-pay | Admitting: Surgical

## 2019-11-25 MED ORDER — HYDROCODONE-ACETAMINOPHEN 5-325 MG PO TABS: 1.0000 | ORAL_TABLET | Freq: Three times a day (TID) | ORAL | 0 refills | Status: AC | PRN

## 2019-11-25 MED ORDER — METHOCARBAMOL 500 MG PO TABS: 500.0000 mg | ORAL_TABLET | Freq: Three times a day (TID) | ORAL | 0 refills | Status: AC | PRN

## 2019-11-25 MED ORDER — CELECOXIB 200 MG PO CAPS: 200.0000 mg | ORAL_CAPSULE | Freq: Two times a day (BID) | ORAL | 0 refills | Status: AC

## 2019-11-25 NOTE — Telephone Encounter (Signed)
Patient called in wanting to talk about getting his medication refilled .

## 2019-11-25 NOTE — Telephone Encounter (Signed)
Patient request rxrf on pain meds.

## 2019-11-25 NOTE — Telephone Encounter (Signed)
Sent in

## 2019-11-25 NOTE — Telephone Encounter (Signed)
Patient advised.

## 2019-11-27 ENCOUNTER — Ambulatory Visit (HOSPITAL_BASED_OUTPATIENT_CLINIC_OR_DEPARTMENT_OTHER)
Admission: RE | Admit: 2019-11-27 | Discharge: 2019-11-27 | Disposition: A | Discharge status: Home / Self Care | Payer: Self-pay | Source: Ambulatory Visit | Attending: Surgical | Admitting: Surgical

## 2019-11-27 ENCOUNTER — Other Ambulatory Visit: Payer: Self-pay

## 2019-11-27 ENCOUNTER — Emergency Department (HOSPITAL_BASED_OUTPATIENT_CLINIC_OR_DEPARTMENT_OTHER)
Admission: EM | Admit: 2019-11-27 | Discharge: 2019-11-27 | Disposition: A | Discharge status: Home / Self Care | Payer: No Typology Code available for payment source

## 2019-11-27 ENCOUNTER — Ambulatory Visit (INDEPENDENT_AMBULATORY_CARE_PROVIDER_SITE_OTHER): Payer: Self-pay | Admitting: Surgical

## 2019-11-27 ENCOUNTER — Ambulatory Visit (INDEPENDENT_AMBULATORY_CARE_PROVIDER_SITE_OTHER): Payer: Self-pay

## 2019-11-27 DIAGNOSIS — S72331D Displaced oblique fracture of shaft of right femur, subsequent encounter for closed fracture with routine healing: Secondary | ICD-10-CM

## 2019-11-27 MED ORDER — CEPHALEXIN 500 MG PO CAPS
500.0000 mg | ORAL_CAPSULE | Freq: Four times a day (QID) | ORAL | 0 refills | Status: AC
Start: 2019-11-27 — End: 2019-12-02

## 2019-12-02 ENCOUNTER — Ambulatory Visit: Payer: Self-pay | Admitting: Surgical

## 2019-12-04 ENCOUNTER — Ambulatory Visit (INDEPENDENT_AMBULATORY_CARE_PROVIDER_SITE_OTHER): Payer: Self-pay | Admitting: Surgical

## 2019-12-04 ENCOUNTER — Encounter: Payer: Self-pay | Admitting: Surgical

## 2019-12-04 ENCOUNTER — Ambulatory Visit (INDEPENDENT_AMBULATORY_CARE_PROVIDER_SITE_OTHER): Payer: Self-pay

## 2019-12-04 DIAGNOSIS — M79671 Pain in right foot: Secondary | ICD-10-CM

## 2019-12-04 DIAGNOSIS — S72331D Displaced oblique fracture of shaft of right femur, subsequent encounter for closed fracture with routine healing: Secondary | ICD-10-CM
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
## 2019-12-06 ENCOUNTER — Encounter: Payer: Self-pay | Admitting: Surgical
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
## 2019-12-06 NOTE — Progress Notes (Signed)
   Post-Op Visit Note   Patient: Cameron Ross           Date of Birth: 18-Nov-2000           MRN: [id number] Visit Date: 11/27/2019 PCP: Patient, No Pcp Per   Assessment & Plan:  Chief Complaint:  Chief Complaint  Patient presents with  . Post-op Follow-up   Visit Diagnoses:  1. Closed displaced oblique fracture of shaft of right femur with routine healing, subsequent encounter     Plan: Patient is a 19 year old male who presents s/p right femoral IM nail on 11/15/2019.  He notes that he is having difficulty putting weight on his right lower extremity.  He notes severe femoral pain.  He has a severe antalgic gait on exam today.  Compliant with taking aspirin.  He does have calf tenderness on exam and a positive Homans' sign.  Ultrasound of the right lower extremity was obtained to rule out DVT and was found to be negative.  Incisions are healing well on exam today but he did have a small amount of drainage coming from the most proximal incision.  No other signs of infection such as erythema or fluctuance.  Short course of 5 days of Keflex was prescribed and he will return in 5 days for a recheck of the incision.  No groin pain throughout the exam or history.  Radiographs of the right femur show good position of the intramedullary nail with no change since prior radiographs at the time of surgery.  No significant displacement of the fracture site.  No callus formation just yet.  No compromise of the hardware.  Plan for patient follow-up in 5 days for clinical recheck.  Patient agreed with plan.  Follow-Up Instructions: No follow-ups on file.   Orders:  Orders Placed This Encounter  Procedures  . XR FEMUR, MIN 2 VIEWS RIGHT   Meds ordered this encounter  Medications  . cephALEXin (KEFLEX) 500 MG capsule    Sig: Take 1 capsule (500 mg total) by mouth 4 (four) times daily for 5 days.    Dispense:  20 capsule    Refill:  0    Imaging: No results found.  PMFS History: Patient  Active Problem List   Diagnosis Date Noted  . Femur fracture (HCC) 11/15/2019  . Closed fracture of right femur with routine healing 11/15/2019   No past medical history on file.  No family history on file.  Past Surgical History:  Procedure Laterality Date  . FEMUR IM NAIL Right 11/15/2019   Procedure: INTRAMEDULLARY (IM) NAIL FEMORAL;  Surgeon: Cammy Copa, MD;  Location: [facility] OR;  Service: Orthopedics;  Laterality: Right;   Social History   Occupational History  . Not on file  Tobacco Use  . Smoking status: Light Tobacco Smoker  . Smokeless tobacco: Never Used  Vaping Use  . Vaping Use: Never used  Substance and Sexual Activity  . Alcohol use: Yes  . Drug use: Never  . Sexual activity: Yes

## 2019-12-23 ENCOUNTER — Ambulatory Visit (INDEPENDENT_AMBULATORY_CARE_PROVIDER_SITE_OTHER): Payer: Self-pay

## 2019-12-23 ENCOUNTER — Encounter: Payer: Self-pay | Admitting: Orthopedic Surgery

## 2019-12-23 ENCOUNTER — Ambulatory Visit (INDEPENDENT_AMBULATORY_CARE_PROVIDER_SITE_OTHER): Payer: Self-pay | Admitting: Orthopedic Surgery

## 2019-12-23 DIAGNOSIS — S72331D Displaced oblique fracture of shaft of right femur, subsequent encounter for closed fracture with routine healing: Secondary | ICD-10-CM

## 2019-12-23 NOTE — Progress Notes (Signed)
   Post-Op Visit Note   Patient: Cameron Ross           Date of Birth: 12/28/2000           MRN: [id number] Visit Date: 12/23/2019 PCP: Patient, No Pcp Per   Assessment & Plan:  Chief Complaint:  Chief Complaint  Patient presents with  . Right Leg - Pain   Visit Diagnoses:  1. Closed displaced oblique fracture of shaft of right femur with routine healing, subsequent encounter     Plan: Cameron Ross is now about a month out right IM nail for femur fracture.  Works in a [employer].  He is doing better.  Using a walker.  On exam he has flexion of the knee to about 90.  Leg lengths equal.  No torsional pain to the right leg.  Plan at this time is weightbearing as tolerated with 4-week return.  Continue to work on knee range of motion exercises.  Repeat radiographs and likely release at that time.  Follow-Up Instructions: Return in about 4 weeks (around 01/20/2020).   Orders:  Orders Placed This Encounter  Procedures  . XR FEMUR, MIN 2 VIEWS RIGHT   No orders of the defined types were placed in this encounter.   Imaging: XR FEMUR, MIN 2 VIEWS RIGHT  Result Date: 12/23/2019 AP lateral right femur reviewed.  Femoral nail in good position alignment with no complicating features.  Some callus formation is present around the fracture site.  No evidence of loosening or lucencies around the proximal or distal interlocking screw.   PMFS History: Patient Active Problem List   Diagnosis Date Noted  . Femur fracture (HCC) 11/15/2019  . Closed fracture of right femur with routine healing 11/15/2019   History reviewed. No pertinent past medical history.  History reviewed. No pertinent family history.  Past Surgical History:  Procedure Laterality Date  . FEMUR IM NAIL Right 11/15/2019   Procedure: INTRAMEDULLARY (IM) NAIL FEMORAL;  Surgeon: Cammy Copa, MD;  Location: [facility] OR;  Service: Orthopedics;  Laterality: Right;   Social History   Occupational History  . Not on file  Tobacco  Use  . Smoking status: Light Tobacco Smoker  . Smokeless tobacco: Never Used  Vaping Use  . Vaping Use: Never used  Substance and Sexual Activity  . Alcohol use: Yes  . Drug use: Never  . Sexual activity: Yes

## 2020-01-20 ENCOUNTER — Ambulatory Visit: Payer: Self-pay | Admitting: Orthopedic Surgery

## 2020-02-01 ENCOUNTER — Ambulatory Visit: Payer: Self-pay | Admitting: Orthopedic Surgery
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
